# Patient Record
Sex: Male | Born: 1980 | Hispanic: Yes | Smoking: Never smoker
Health system: Southern US, Community
[De-identification: ages and names within clinical notes are randomized; demographics above are authoritative.]

---

## 2016-05-21 ENCOUNTER — Emergency Department (HOSPITAL_COMMUNITY)
Admission: EM | Admit: 2016-05-21 | Discharge: 2016-05-21 | Disposition: A | Discharge status: Home / Self Care | Payer: Self-pay | Attending: Emergency Medicine | Admitting: Emergency Medicine

## 2016-05-21 ENCOUNTER — Encounter (HOSPITAL_COMMUNITY): Payer: Self-pay

## 2016-05-21 ENCOUNTER — Emergency Department (HOSPITAL_COMMUNITY): Payer: Self-pay

## 2016-05-21 DIAGNOSIS — J0100 Acute maxillary sinusitis, unspecified: Secondary | ICD-10-CM | POA: Insufficient documentation

## 2016-05-21 DIAGNOSIS — R05 Cough: Secondary | ICD-10-CM

## 2016-05-21 DIAGNOSIS — J40 Bronchitis, not specified as acute or chronic: Secondary | ICD-10-CM | POA: Insufficient documentation

## 2016-05-21 DIAGNOSIS — R059 Cough, unspecified: Secondary | ICD-10-CM

## 2016-05-21 MED ORDER — ACETAMINOPHEN 500 MG PO TABS
1000.0000 mg | ORAL_TABLET | Freq: Once | ORAL | Status: AC
  Administered 2016-05-21: 1000 mg via ORAL
  Filled 2016-05-21: qty 2

## 2016-05-21 MED ORDER — AMOXICILLIN 500 MG PO CAPS: 500.0000 mg | ORAL_CAPSULE | Freq: Three times a day (TID) | ORAL | 0 refills | Status: AC

## 2016-05-21 MED ORDER — BENZONATATE 100 MG PO CAPS: 100.0000 mg | ORAL_CAPSULE | Freq: Three times a day (TID) | ORAL | 0 refills | Status: AC

## 2016-05-21 MED ORDER — ALBUTEROL SULFATE HFA 108 (90 BASE) MCG/ACT IN AERS
2.0000 | INHALATION_SPRAY | RESPIRATORY_TRACT | Status: DC | PRN
  Administered 2016-05-21: 2 via RESPIRATORY_TRACT
  Filled 2016-05-21: qty 6.7

## 2016-05-21 NOTE — ED Notes (Signed)
Patient comes in with c/o flu-like symptoms for a week, body aches and chills, cough, h/a, rhinorrhea, believes he's had fevers. Now c/o pink-tinged sputum starting yest. Patient has not been seen anywhere for this. Patient states he's just been resting and drinking fluids.

## 2016-05-21 NOTE — ED Notes (Signed)
Dr. Jeraldine [facility]ootsLockwood [facility] aware of oral temp 103.2 Verbal to give 1g Tylenol

## 2016-05-21 NOTE — ED Triage Notes (Signed)
Per PT, PT is coming from home with fever, cough, congestion, and headache x1 week. Reports today noting some blood in his sputum. Pt reports taking OTC medication with some relief.

## 2016-05-21 NOTE — ED Provider Notes (Signed)
[facility]-EMERGENCY DEPT Provider Note   CSN: [id number] Arrival date & time: 05/21/16  1813   By signing my name below, I, Clovis Pu, attest that this documentation has been prepared under the direction and in the presence of  Kerrie Buffalo, NP. Electronically Signed: Clovis Pu, ED Scribe. 05/21/16. 7:09 PM.   History   Chief Complaint Chief Complaint  Patient presents with  . Cough   The history is provided by the patient. No language interpreter was used.  Cough  This is a new problem. The current episode started more than 1 week ago. The problem occurs every few minutes. The problem has not changed since onset.The cough is productive of sputum. There has been no fever. Associated symptoms include chills, ear pain, rhinorrhea and myalgias. Pertinent negatives include no sore throat. He has tried nothing for the symptoms. The treatment provided no relief. He is not a smoker.   HPI Comments:  Trevor Coleman is a 36 y.o. male who presents to the Emergency Department complaining of generalized body aches onset 1 week. Pt also reports chills, subjective fever, sinus pressure, congestion, productive cough with yellow sputum and red streaking, rhinorrhea and ear pain. No alleviating factors noted. Pt denies a sore throat, any recent sick contacts or any other associated symptoms. Pt is a non-smoker.   History reviewed. No pertinent past medical history.  There are no active problems to display for this patient.   History reviewed. No pertinent surgical history.   Home Medications    Prior to Admission medications   Medication Sig Start Date End Date Taking? Authorizing Provider  amoxicillin (AMOXIL) 500 MG capsule Take 1 capsule (500 mg total) by mouth 3 (three) times daily. 05/21/16   Oden Lindaman Orlene Och, NP  benzonatate (TESSALON) 100 MG capsule Take 1 capsule (100 mg total) by mouth every 8 (eight) hours. 05/21/16   Tasnim Balentine Orlene Och, NP    Family History No family history on file.  Social  History Social History  Substance Use Topics  . Smoking status: Never Smoker  . Smokeless tobacco: Never Used  . Alcohol use No     Allergies   Patient has no known allergies.   Review of Systems Review of Systems  Constitutional: Positive for chills.  HENT: Positive for congestion, ear pain and rhinorrhea. Negative for sore throat.   Respiratory: Positive for cough.   Gastrointestinal: Negative for abdominal pain.  Musculoskeletal: Positive for myalgias.     Physical Exam Updated Vital Signs BP 151/89 (BP Location: Left Arm)   Pulse 103   Temp 100.9 F (38.3 C) (Oral)   Resp 18   Ht 5\' 6"  (1.676 m)   Wt 88.5 kg   SpO2 100%   BMI 31.47 kg/m   Physical Exam  Constitutional: He is oriented to person, place, and time. He appears well-developed and well-nourished. No distress.  HENT:  Head: Normocephalic and atraumatic.  Right Ear: Tympanic membrane normal.  Left Ear: Tympanic membrane normal.  Nose: Right sinus exhibits maxillary sinus tenderness. Left sinus exhibits maxillary sinus tenderness.  Mouth/Throat: Uvula is midline, oropharynx is clear and moist and mucous membranes are normal. No posterior oropharyngeal edema or posterior oropharyngeal erythema.  Maxillary sinus tenderness. Nasal congestion present   Eyes: Conjunctivae are normal.  Neck: Normal range of motion.  Cardiovascular: Normal rate.   Pulmonary/Chest: Effort normal and breath sounds normal. He has no wheezes. He has no rales.  Abdominal: He exhibits no distension.  Lymphadenopathy:    He has no  cervical adenopathy.  Neurological: He is alert and oriented to person, place, and time.  Skin: Skin is warm and dry.  Psychiatric: He has a normal mood and affect.  Nursing note and vitals reviewed.    ED Treatments / Results  DIAGNOSTIC STUDIES:  Oxygen Saturation is 100% on RA, normal by my interpretation.    COORDINATION OF CARE:  7:08 PM Discussed treatment plan with pt at bedside and pt  agreed to plan.  Labs (all labs ordered are listed, but only abnormal results are displayed) Labs Reviewed - No data to display  [facility]  Result Date: 05/21/2016 CLINICAL DATA:  Body aches for 1 week fever.  Chills EXAM: PORTABLE CHEST 1 VIEW COMPARISON:  None FINDINGS: The heart size and mediastinal contours are within normal limits. Both lungs are clear. The visualized skeletal structures are unremarkable. IMPRESSION: No active disease. Electronically Signed   By: Signa Kellaylor  Stroud M.D.   On: 05/21/2016 20:20    Procedures Procedures (including critical care time)  Medications Ordered in ED Medications  albuterol (PROVENTIL HFA;VENTOLIN HFA) 108 (90 Base) MCG/ACT inhaler 2 puff (not administered)     Initial Impression / Assessment and Plan / ED Course  I have reviewed the triage vital signs and the nursing notes.  Pertinent labs & imaging results that were available during my care of the patient were reviewed by me and considered in my medical decision making (see chart for details).     Pt symptoms consistent with sinusitis/bronchitis. CXR negative for acute infiltrate. Pt will be discharged with antibiotics and cough medication.  Discussed return precautions.  Pt is hemodynamically stable & in NAD prior to discharge.  Final Clinical Impressions(s) / ED Diagnoses   Final diagnoses:  Acute maxillary sinusitis, recurrence not specified  Bronchitis    New Prescriptions New Prescriptions   AMOXICILLIN (AMOXIL) 500 MG CAPSULE    Take 1 capsule (500 mg total) by mouth 3 (three) times daily.   BENZONATATE (TESSALON) 100 MG CAPSULE    Take 1 capsule (100 mg total) by mouth every 8 (eight) hours.  I personally performed the services described in this documentation, which was scribed in my presence. The recorded information has been reviewed and is accurate.     [address]Hope DouglasM Takila Kronberg, [place]NP 05/21/16 2032    Gerhard Munchobert Lockwood, MD 05/21/16 2053

## 2017-11-28 IMAGING — DX DG CHEST 1V PORT
1 series · 1 of 1 positions shown · non-contrast
Comparison: None

CLINICAL DATA: Body aches for 1 week fever.  Chills

EXAM:
PORTABLE CHEST 1 VIEW

[chest ap]
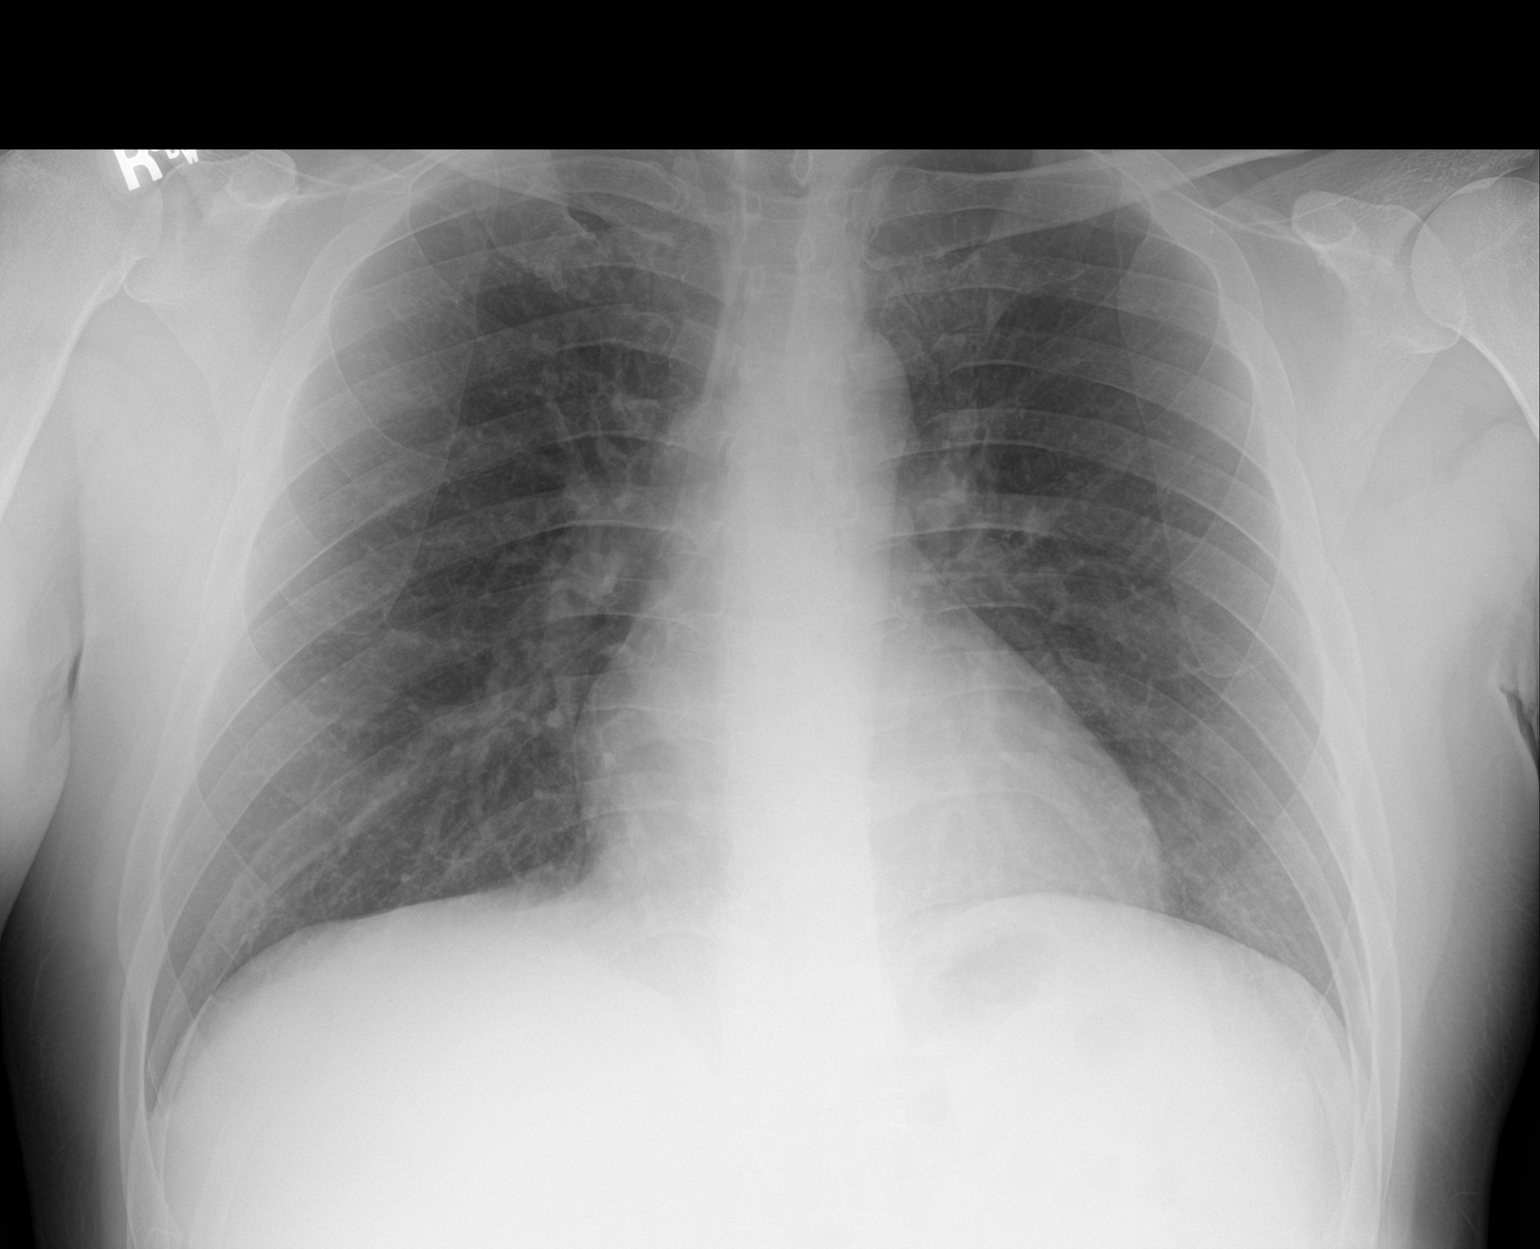

[1 of 1 positions shown; findings below may reference images not displayed]

FINDINGS: The heart size and mediastinal contours are within normal limits.
Both lungs are clear. The visualized skeletal structures are
unremarkable.
IMPRESSION: No active disease.

## 2024-03-16 ENCOUNTER — Emergency Department (HOSPITAL_COMMUNITY)
Admission: EM | Admit: 2024-03-16 | Discharge: 2024-03-17 | Disposition: A | Discharge status: Home / Self Care | Payer: Self-pay | Attending: Emergency Medicine | Admitting: Emergency Medicine

## 2024-03-16 ENCOUNTER — Other Ambulatory Visit: Payer: Self-pay

## 2024-03-16 ENCOUNTER — Encounter (HOSPITAL_COMMUNITY): Payer: Self-pay | Admitting: Emergency Medicine

## 2024-03-16 DIAGNOSIS — Z23 Encounter for immunization: Secondary | ICD-10-CM | POA: Insufficient documentation

## 2024-03-16 DIAGNOSIS — W260XXA Contact with knife, initial encounter: Secondary | ICD-10-CM | POA: Insufficient documentation

## 2024-03-16 DIAGNOSIS — S61211A Laceration without foreign body of left index finger without damage to nail, initial encounter: Secondary | ICD-10-CM | POA: Insufficient documentation

## 2024-03-16 MED ORDER — TETANUS-DIPHTH-ACELL PERTUSSIS 5-2-15.5 LF-MCG/0.5 IM SUSP
0.5000 mL | Freq: Once | INTRAMUSCULAR | Status: AC
  Administered 2024-03-16: 0.5 mL via INTRAMUSCULAR
  Filled 2024-03-16: qty 0.5

## 2024-03-16 NOTE — ED Triage Notes (Signed)
 Quick triage note: Pt to ED c/o left index finger injury. Reports aprox 2 hours ago was cutting wood with utility knife to install floor and cut finger, bleeding controlled, currently wrapped.   Unknown last tetanus shot.

## 2024-03-16 NOTE — ED Provider Triage Note (Signed)
 Emergency Medicine Provider Triage Evaluation Note  [name] , a 43 y.o. male  was evaluated in triage.  Pt complains of left index finger laceration from a utility knife.  He was cutting flooring when this occurred.  Last tetanus shot was 8 years ago.  He is right-hand dominant  Review of Systems  Positive: As above Negative: As above  Physical Exam  BP (!) 142/90 (BP Location: Right Arm)   Pulse 74   Temp 98.2 F (36.8 C)   Resp 16   SpO2 95%  Gen:   Awake, no distress   Resp:  Normal effort  MSK:   Moves extremities without difficulty  Other:  Laceration noted to left index finger.  About 3 cm in length.  No active bleeding.  Medical Decision Making  Medically screening exam initiated at 8:25 PM.  Appropriate orders placed.  Marston Mccadden was informed that the remainder of the evaluation will be completed by another provider, this initial triage assessment does not replace that evaluation, and the importance of remaining in the ED until their evaluation is complete.     Hildegard Loge, PA-C 03/16/24 2026

## 2024-03-17 MED ORDER — LIDOCAINE-EPINEPHRINE (PF) 2 %-1:200000 IJ SOLN
10.0000 mL | Freq: Once | INTRAMUSCULAR | Status: AC
  Administered 2024-03-17: 10 mL
  Filled 2024-03-17: qty 20

## 2024-03-17 NOTE — ED Notes (Signed)
 Wound care performed on the wound, and clean dressing applied.

## 2024-03-17 NOTE — Discharge Instructions (Signed)
 You received sutures which are not dissolvable. Please return in 7-10 days for suture removal. I would place a bandage on the affected area and change it at least once daily, or more frequently if it becomes soiled. When changing bandage, clean the wound thoroughly with soap and water, and place a thin layer of Polysporin or Neosporin directly over top of the wound before replacing bandage. Please monitor for any signs of developing infection including worsening redness, pain, pus draining from the affected site.  If you are concerned about any developing infection I recommend that you return for further evaluation for management.

## 2024-03-17 NOTE — ED Provider Notes (Signed)
 " [facility] Provider Note   CSN: [id number] Arrival date & time: 03/16/24  1940     Patient presents with: Finger Injury (Left index)   Trevor Coleman is a 43 y.o. male with overall noncontributory past medical history, not up-to-date on his tetanus who presents with concern for laceration to left index finger.  Laceration at the outside base of the left index finger on the lateral/palmar aspect overlying the MCP.  Occurred 2 hours prior to arrival.  Bleeding controlled at time of ED evaluation.   HPI     Prior to Admission medications  Medication Sig Start Date End Date Taking? Authorizing Provider  amoxicillin  (AMOXIL ) 500 MG capsule Take 1 capsule (500 mg total) by mouth 3 (three) times daily. 05/21/16   Jamelle Lorrayne HERO, NP  benzonatate  (TESSALON ) 100 MG capsule Take 1 capsule (100 mg total) by mouth every 8 (eight) hours. 05/21/16   Jamelle Lorrayne HERO, NP    Allergies: Patient has no known allergies.    Review of Systems  All other systems reviewed and are negative.   Updated Vital Signs BP (!) 145/99 (BP Location: Right Arm)   Pulse 69   Temp 97.8 F (36.6 C) (Oral)   Resp 18   SpO2 100%   Physical Exam Vitals and nursing note reviewed.  Constitutional:      General: He is not in acute distress.    Appearance: Normal appearance.  HENT:     Head: Normocephalic and atraumatic.  Eyes:     General:        Right eye: No discharge.        Left eye: No discharge.  Cardiovascular:     Rate and Rhythm: Normal rate and regular rhythm.     Pulses: Normal pulses.  Pulmonary:     Effort: Pulmonary effort is normal. No respiratory distress.  Musculoskeletal:        General: No deformity.     Comments: Normal range of motion, normal strength to flexion, extension of the affected left index finger at level of MCP and all distal phalanx segments.  Skin:    General: Skin is warm and dry.     Capillary Refill: Capillary refill takes  less than 2 seconds.     Comments: Approximately 3 to 4 cm laceration at the palmar/lateral aspect of the left index finger at the level of the MCP.  Neurological:     Mental Status: He is alert and oriented to person, place, and time.  Psychiatric:        Mood and Affect: Mood normal.        Behavior: Behavior normal.     (all labs ordered are listed, but only abnormal results are displayed) Labs Reviewed - No data to display  EKG: None  Radiology: No results found.   .Laceration Repair  Date/Time: 03/17/2024 3:29 AM  Performed by: Rosan Sherlean DEL, PA-C Authorized by: Rosan Sherlean DEL, PA-C   Consent:    Consent obtained:  Verbal   Consent given by:  Patient   Risks, benefits, and alternatives were discussed: yes     Risks discussed:  Infection, pain, poor cosmetic result, tendon damage, vascular damage, poor wound healing, need for additional repair and nerve damage   Alternatives discussed:  No treatment Universal protocol:    Procedure explained and questions answered to patient or proxy's satisfaction: yes     Patient identity confirmed:  Verbally with patient Anesthesia:  Anesthesia method:  Local infiltration   Local anesthetic:  Lidocaine  2% WITH epi Laceration details:    Location:  Finger   Finger location:  L index finger   Length (cm):  3.5   Depth (mm):  4 Treatment:    Area cleansed with:  Povidone-iodine and soap and water   Amount of cleaning:  Standard Skin repair:    Repair method:  Sutures   Suture size:  4-0   Suture material:  Prolene   Suture technique:  Simple interrupted   Number of sutures:  5 Approximation:    Approximation:  Close Repair type:    Repair type:  Simple Post-procedure details:    Dressing:  Non-adherent dressing   Procedure completion:  Tolerated    Medications Ordered in the ED  Tdap (ADACEL ) injection 0.5 mL (0.5 mLs Intramuscular Given 03/16/24 2036)  lidocaine -EPINEPHrine  (XYLOCAINE  W/EPI) 2  %-1:200000 (PF) injection 10 mL (10 mLs Infiltration Given 03/17/24 0356)                                    Medical Decision Making Risk Prescription drug management.   This is an overall well-appearing 43yo male who presents with a laceration of the left index finger.  The wound was explored through full range of motion, there is no evidence of foreign body, fascia rupture, damage to the deep vessels, capillary refill is intact distal to the site of the laceration.  Patient is no up-to-date on their tetanus, tetanus updated today.  They are neurovascularly intact throughout on my exam, intact strength of the affected extremity.   Wound was extensively cleaned, and repaired as described above.  Patient tolerated the procedure without difficulty.  Nonadherent dressing was placed.  Patient instructed to return in 7-10 days for suture removal, and monitor for signs of infection including worsening pain, redness, pus draining from the affected site.  Instructed to keep the wound clean, bandage, and change the bandage at least once daily.  Patient understands and agrees to the plan, and is discharged in stable condition at this time.  Final diagnoses:  Laceration of left index finger without foreign body without damage to nail, initial encounter    ED Discharge Orders     None          Rosan Sherlean VEAR DEVONNA 03/17/24 0408    Raford Lenis, MD 03/17/24 0700  "
